# Patient Record
Sex: Male | Born: 1935 | Race: White | Marital: Married | Smoking: Never smoker
Health system: Southern US, Community
[De-identification: ages and names within clinical notes are randomized; demographics above are authoritative.]

## PROBLEM LIST (undated history)

## (undated) DIAGNOSIS — M25552 Pain in left hip: Secondary | ICD-10-CM

## (undated) DIAGNOSIS — K5790 Diverticulosis of intestine, part unspecified, without perforation or abscess without bleeding: Secondary | ICD-10-CM

## (undated) DIAGNOSIS — J45909 Unspecified asthma, uncomplicated: Secondary | ICD-10-CM

## (undated) DIAGNOSIS — G8929 Other chronic pain: Secondary | ICD-10-CM

## (undated) DIAGNOSIS — Z889 Allergy status to unspecified drugs, medicaments and biological substances status: Secondary | ICD-10-CM

## (undated) DIAGNOSIS — I82409 Acute embolism and thrombosis of unspecified deep veins of unspecified lower extremity: Secondary | ICD-10-CM

## (undated) DIAGNOSIS — K219 Gastro-esophageal reflux disease without esophagitis: Secondary | ICD-10-CM

## (undated) DIAGNOSIS — I10 Essential (primary) hypertension: Secondary | ICD-10-CM

## (undated) DIAGNOSIS — M810 Age-related osteoporosis without current pathological fracture: Secondary | ICD-10-CM

## (undated) DIAGNOSIS — M549 Dorsalgia, unspecified: Secondary | ICD-10-CM

## (undated) HISTORY — DX: Gastro-esophageal reflux disease without esophagitis: K21.9

## (undated) HISTORY — PX: MENISCUS REPAIR: SHX5179

## (undated) HISTORY — DX: Dorsalgia, unspecified: M54.9

## (undated) HISTORY — DX: Pain in left hip: M25.552

## (undated) HISTORY — DX: Other chronic pain: G89.29

## (undated) HISTORY — PX: CATARACT EXTRACTION: SUR2

## (undated) HISTORY — DX: Unspecified asthma, uncomplicated: J45.909

## (undated) HISTORY — DX: Allergy status to unspecified drugs, medicaments and biological substances: Z88.9

## (undated) HISTORY — DX: Essential (primary) hypertension: I10

## (undated) HISTORY — DX: Diverticulosis of intestine, part unspecified, without perforation or abscess without bleeding: K57.90

## (undated) HISTORY — DX: Age-related osteoporosis without current pathological fracture: M81.0

## (undated) HISTORY — DX: Acute embolism and thrombosis of unspecified deep veins of unspecified lower extremity: I82.409

---

## 2013-09-09 ENCOUNTER — Encounter: Payer: Self-pay | Admitting: Interventional Cardiology

## 2013-09-12 ENCOUNTER — Other Ambulatory Visit: Payer: Self-pay | Admitting: Interventional Cardiology

## 2013-09-13 ENCOUNTER — Encounter: Payer: Self-pay | Admitting: Interventional Cardiology

## 2013-09-13 ENCOUNTER — Ambulatory Visit (INDEPENDENT_AMBULATORY_CARE_PROVIDER_SITE_OTHER): Payer: Medicare Other | Admitting: Interventional Cardiology

## 2013-09-13 ENCOUNTER — Ambulatory Visit (INDEPENDENT_AMBULATORY_CARE_PROVIDER_SITE_OTHER): Payer: Medicare Other | Admitting: Pharmacist

## 2013-09-13 VITALS — BP 135/75 | HR 89 | Ht 69.0 in | Wt 195.0 lb

## 2013-09-13 DIAGNOSIS — R609 Edema, unspecified: Secondary | ICD-10-CM

## 2013-09-13 DIAGNOSIS — I359 Nonrheumatic aortic valve disorder, unspecified: Secondary | ICD-10-CM | POA: Insufficient documentation

## 2013-09-13 DIAGNOSIS — I824Y9 Acute embolism and thrombosis of unspecified deep veins of unspecified proximal lower extremity: Secondary | ICD-10-CM | POA: Insufficient documentation

## 2013-09-13 DIAGNOSIS — I8010 Phlebitis and thrombophlebitis of unspecified femoral vein: Secondary | ICD-10-CM

## 2013-09-13 DIAGNOSIS — Z79899 Other long term (current) drug therapy: Secondary | ICD-10-CM

## 2013-09-13 DIAGNOSIS — I10 Essential (primary) hypertension: Secondary | ICD-10-CM

## 2013-09-13 LAB — POCT INR: INR: 2.2

## 2013-09-13 MED ORDER — FUROSEMIDE 40 MG PO TABS: ORAL_TABLET | ORAL | Status: DC

## 2013-09-13 NOTE — Progress Notes (Signed)
Patient ID: Juan Tran, male   DOB: 11/16/1925, 79 y.o.   MRN: [id number]    [address] [place], [place]  [address] Phone: [phone] Fax:  [phone]  Date:  09/13/2013   ID:  Juan Tran, DOB 07/13/1925, MRN [id number]  PCP:  No primary provider on file.      History of Present Illness: Juan Tran is a 79 y.o. male with a history of DVT. He also has severe aortic stenosis. He denies any chest pain, dyspnea on exertion or syncope. His walking is most limited by left hip pain. Surgery has been suggested. He has been concerned about the rehab. He has chronic lower extremity edema which is worse. He takes Coumadin for DVT. He has no bleeding problems. He is on several blood pressure medications. His blood pressure has been controlled. He is off of Tekturna.  Leg swelling improves with elevation. he does have some breathing difficulty which he thinks is related to allergies and bending down.  He is unsure about hip surgery.      Wt Readings from Last 3 Encounters:  09/13/13 195 lb (88.451 kg)     Past Medical History  Diagnosis Date  . Chronic back pain   . DVT (deep venous thrombosis)     on chronic coumandin  . GERD (gastroesophageal reflux disease)   . Asthma   . Osteoporosis     with compresssions fractures, formerly seeing pain managementfor epidurals,has had kyphoplasty  . Left hip pain   . H/O seasonal allergies   . Diverticulosis     barium enema 2005  . Hypertension     echo4/16/2013/ LV EF estimated 2D at 60-65 %    Current Outpatient Prescriptions  Medication Sig Dispense Refill  . albuterol (PROVENTIL HFA;VENTOLIN HFA) 108 (90 BASE) MCG/ACT inhaler Inhale 2 puffs into the lungs every 6 (six) hours as needed for wheezing.      . Azelastine-Fluticasone (DYMISTA NA) Place into the nose. 1 spray each nostril twice a day      . budesonide (PULMICORT) 180 MCG/ACT inhaler Inhale 1 puff into the lungs 2 (two) times daily.      . Calcium  Carbonate-Vitamin D (CALCIUM + D PO) Take by mouth. Take daily      . carvedilol (COREG) 6.25 MG tablet Take 6.25 mg by mouth 2 (two) times daily with a meal.      . dexlansoprazole (DEXILANT) 60 MG capsule Take 60 mg by mouth daily.      Marland Kitchen Fexofenadine HCl (MUCINEX ALLERGY PO) Take by mouth.      . fluticasone (FLONASE) 50 MCG/ACT nasal spray Place 2 sprays into the nose daily.      . furosemide (LASIX) 20 MG tablet Take 2 tabs daily      . NIFEdipine (PROCARDIA-XL/ADALAT CC) 60 MG 24 hr tablet Take 60 mg by mouth daily.      . tamsulosin (FLOMAX) 0.4 MG CAPS capsule Take by mouth. Take 1 tab daily      . traMADol (ULTRAM) 50 MG tablet Take 50 mg by mouth every 6 (six) hours as needed for pain.      Marland Kitchen warfarin (COUMADIN) 5 MG tablet TAKE 1/2 TABLET BY MOUTH EVERY DAY, EXCEPT 1 TABLET BY MOUTH ON SATURDAYS  30 tablet  0   No current facility-administered medications for this visit.    Allergies:    Allergies  Allergen Reactions  . Asa [Aspirin]   . Cat Hair Extract  Social History:  The patient  reports that he has never smoked. He does not have any smokeless tobacco history on file. He reports that he drinks alcohol.   Family History:  The patient's family history is not on file.   ROS:  Please see the history of present illness.  No nausea, vomiting.  No fevers, chills.  No focal weakness.  No dysuria. Joint pains,  All other systems reviewed and negative.   PHYSICAL EXAM: VS:  BP 135/75  Pulse 89  Ht 5\' 9"  (1.753 m)  Wt 195 lb (88.451 kg)  BMI 28.78 kg/m2 Well nourished, well developed, in no acute distress HEENT: normal Neck: no JVD, no carotid bruits Cardiac:  normal S1, S2; irregularly irregularity 3/6 systolic murmur Lungs:  clear to auscultation bilaterally, no wheezing, rhonchi or rales Abd: soft, nontender, no hepatomegaly Ext: no edema Skin: warm and dry Neuro:   no focal abnormalities noted       ASSESSMENT AND PLAN:  Aortic valve disorders  Notes: We  discussed his severe aortic stenosis. HE may be a candidate for TAVR. Mean gradient across aortic valve is greater than 40 mm Hg. His mobility is significantly limited. I think rehabilitation from open valve replacement may be difficult. He is not having much in the way of cardiac symptoms. General anesthesia may be difficult for him to tolerate if he needs a hip replacement. He will let us know if he develops CP, SHOB, or lightheadedness/passing out.  he is not very interested in further workup for TAVR.  He is willing to repeat an echocardiogram.  2. Benign hypertension /leg swelling Notes: Controlled. Avoid hypotension given aortic stenosis.  will increase Lasix to 40 mg twice a day to help with leg swelling. Elevate legs as well.  3. Anticoagulant long-term use  Notes: INR follwed. COumadin for DVT.  4. Shortness of breath   Notes: likely multifactorial. He thinks he is having problems with allergies. Given his severe aortic stenosis, we'll  recheck LV function.    Signed, Fredric Mare, MD, [facility] 09/13/2013 2:52 PM

## 2013-09-13 NOTE — Patient Instructions (Addendum)
Your physician wants you to follow-up in: 6 months with Dr. Eldridge Dace. You will receive a reminder letter in the mail two months in advance. If you don't receive a letter, please call our office to schedule the follow-up appointment.  Your physician recommends that you return for lab work in: 1 week on October 15th.  Increase Lasix to 40mg  1 tablet twice a day.  Your physician has requested that you have an echocardiogram. Echocardiography is a painless test that uses sound waves to create images of your heart. It provides your doctor with information about the size and shape of your heart and how well your heart's chambers and valves are working. This procedure takes approximately one hour. There are no restrictions for this procedure.

## 2013-09-14 ENCOUNTER — Ambulatory Visit: Payer: Self-pay | Admitting: Interventional Cardiology

## 2013-09-22 ENCOUNTER — Other Ambulatory Visit (INDEPENDENT_AMBULATORY_CARE_PROVIDER_SITE_OTHER): Payer: Medicare Other

## 2013-09-22 ENCOUNTER — Ambulatory Visit (HOSPITAL_COMMUNITY): Payer: Medicare Other | Attending: Internal Medicine

## 2013-09-22 ENCOUNTER — Other Ambulatory Visit (HOSPITAL_COMMUNITY): Payer: Self-pay | Admitting: Interventional Cardiology

## 2013-09-22 DIAGNOSIS — R609 Edema, unspecified: Secondary | ICD-10-CM | POA: Insufficient documentation

## 2013-09-22 DIAGNOSIS — I359 Nonrheumatic aortic valve disorder, unspecified: Secondary | ICD-10-CM

## 2013-09-22 DIAGNOSIS — I10 Essential (primary) hypertension: Secondary | ICD-10-CM

## 2013-09-22 DIAGNOSIS — Z79899 Other long term (current) drug therapy: Secondary | ICD-10-CM

## 2013-09-22 DIAGNOSIS — Z86718 Personal history of other venous thrombosis and embolism: Secondary | ICD-10-CM | POA: Insufficient documentation

## 2013-09-22 LAB — BASIC METABOLIC PANEL
CO2: 27 mEq/L (ref 19–32)
Chloride: 102 mEq/L (ref 96–112)
Creatinine, Ser: 1.4 mg/dL (ref 0.4–1.5)
Glucose, Bld: 96 mg/dL (ref 70–99)
Potassium: 4.3 mEq/L (ref 3.5–5.1)
Sodium: 139 mEq/L (ref 135–145)

## 2013-09-22 NOTE — Progress Notes (Signed)
Echocardiogram performed.  

## 2013-09-28 ENCOUNTER — Telehealth: Payer: Self-pay | Admitting: Cardiology

## 2013-09-28 DIAGNOSIS — Z79899 Other long term (current) drug therapy: Secondary | ICD-10-CM

## 2013-09-28 MED ORDER — FUROSEMIDE 40 MG PO TABS: ORAL_TABLET | ORAL | Status: DC

## 2013-09-28 NOTE — Telephone Encounter (Signed)
Message copied by Theda Sers on Thu Sep 28, 2013  8:32 AM ------      Message from: Corky Crafts      Created: Tue Sep 26, 2013  6:04 PM       OK to increase to 80 mg in AM and 40 mg in afternoon.  BMet in one week. ------

## 2013-09-28 NOTE — Telephone Encounter (Signed)
Pt notified to increase lasix. Meds updated and Bmet ordered.

## 2013-09-28 NOTE — Progress Notes (Signed)
Pt notified. Meds updated and labs ordered for 10/05/13.

## 2013-10-05 ENCOUNTER — Other Ambulatory Visit (INDEPENDENT_AMBULATORY_CARE_PROVIDER_SITE_OTHER): Payer: Medicare Other

## 2013-10-05 DIAGNOSIS — R609 Edema, unspecified: Secondary | ICD-10-CM

## 2013-10-05 DIAGNOSIS — I10 Essential (primary) hypertension: Secondary | ICD-10-CM

## 2013-10-05 DIAGNOSIS — Z79899 Other long term (current) drug therapy: Secondary | ICD-10-CM

## 2013-10-05 DIAGNOSIS — I824Y9 Acute embolism and thrombosis of unspecified deep veins of unspecified proximal lower extremity: Secondary | ICD-10-CM

## 2013-10-05 DIAGNOSIS — I359 Nonrheumatic aortic valve disorder, unspecified: Secondary | ICD-10-CM

## 2013-10-05 LAB — BASIC METABOLIC PANEL
BUN: 21 mg/dL (ref 6–23)
CO2: 26 mEq/L (ref 19–32)
Chloride: 104 mEq/L (ref 96–112)
Creatinine, Ser: 1.3 mg/dL (ref 0.4–1.5)
GFR: 55.09 mL/min — ABNORMAL LOW (ref >60.00)
Sodium: 139 mEq/L (ref 135–145)

## 2013-10-15 ENCOUNTER — Other Ambulatory Visit (HOSPITAL_COMMUNITY): Payer: Self-pay | Admitting: Interventional Cardiology

## 2013-10-25 ENCOUNTER — Ambulatory Visit (INDEPENDENT_AMBULATORY_CARE_PROVIDER_SITE_OTHER): Payer: Medicare Other | Admitting: Pharmacist

## 2013-10-25 ENCOUNTER — Encounter (INDEPENDENT_AMBULATORY_CARE_PROVIDER_SITE_OTHER): Payer: Self-pay

## 2013-10-25 DIAGNOSIS — I8010 Phlebitis and thrombophlebitis of unspecified femoral vein: Secondary | ICD-10-CM

## 2013-10-25 LAB — POCT INR: INR: 2.3

## 2013-10-25 MED ORDER — FUROSEMIDE 40 MG PO TABS: ORAL_TABLET | ORAL | Status: AC

## 2013-11-20 ENCOUNTER — Other Ambulatory Visit: Payer: Self-pay | Admitting: Interventional Cardiology

## 2013-12-05 ENCOUNTER — Ambulatory Visit (INDEPENDENT_AMBULATORY_CARE_PROVIDER_SITE_OTHER): Payer: Medicare Other | Admitting: Pharmacist

## 2013-12-05 DIAGNOSIS — I8010 Phlebitis and thrombophlebitis of unspecified femoral vein: Secondary | ICD-10-CM

## 2013-12-05 LAB — POCT INR: INR: 3.2

## 2013-12-28 ENCOUNTER — Ambulatory Visit
Admission: RE | Admit: 2013-12-28 | Discharge: 2013-12-28 | Disposition: A | Discharge status: Home / Self Care | Payer: Medicare Other | Source: Ambulatory Visit | Attending: Internal Medicine | Admitting: Internal Medicine

## 2013-12-28 ENCOUNTER — Other Ambulatory Visit: Payer: Self-pay | Admitting: Internal Medicine

## 2013-12-28 DIAGNOSIS — R52 Pain, unspecified: Secondary | ICD-10-CM

## 2013-12-28 DIAGNOSIS — R609 Edema, unspecified: Secondary | ICD-10-CM

## 2014-01-01 ENCOUNTER — Telehealth: Payer: Self-pay | Admitting: Interventional Cardiology

## 2014-01-01 NOTE — Telephone Encounter (Signed)
Juan Tran, can pt come off coumadin for a procedure? Dr. Eldridge DaceVaranasi will not be back in the office until next week.

## 2014-01-01 NOTE — Telephone Encounter (Signed)
Patient had a DVT in the 1990's and then it seems had another DVT occur after coming off coumadin, so was put on coumadin for lifelong therapy.  What sort of procedure does patient need to have?  I don't see anything in [facility] system.

## 2014-01-01 NOTE — Telephone Encounter (Signed)
Pt is scheduled to see you tomorrow and he will speak with you tomorrow about this. I told pt he is supposed to be coumadin lifelong and he is aware.

## 2014-01-01 NOTE — Telephone Encounter (Signed)
New Message  Dr. Gust BroomsJaralla called states the pt is on coumadin and request a call back to determine the possiblity of coming off of the coumadin for a procedure// Please call.

## 2014-01-01 NOTE — Telephone Encounter (Signed)
Spoke with pt and he went to [facility] for his annual check up and Dr. Gust BroomsJaralla suggested that pt have doppler to see if pt needed to continue the coumdain.

## 2014-01-02 ENCOUNTER — Ambulatory Visit (INDEPENDENT_AMBULATORY_CARE_PROVIDER_SITE_OTHER): Payer: Medicare Other | Admitting: Pharmacist

## 2014-01-02 DIAGNOSIS — Z5181 Encounter for therapeutic drug level monitoring: Secondary | ICD-10-CM

## 2014-01-02 DIAGNOSIS — I824Y9 Acute embolism and thrombosis of unspecified deep veins of unspecified proximal lower extremity: Secondary | ICD-10-CM

## 2014-01-02 DIAGNOSIS — I8010 Phlebitis and thrombophlebitis of unspecified femoral vein: Secondary | ICD-10-CM

## 2014-01-02 LAB — POCT INR: INR: 2.7

## 2014-01-02 NOTE — Telephone Encounter (Signed)
Discussed with patient in clinic.  INR was 2.7 today.  Patient a DVT in 1990s, started warfarin, then after stopping it developed a questionable DVT, and was told he would be lifelong coumadin patient.  A doppler was never done following this second possible DVT per patient.  Patient had a doppler done a few days ago 12/28/13 per Dr. Gust BroomsJaralla which was negative for DVT.  She will likely call you in a few days to discuss option of stopping vs continuing warfarin.   FYI.

## 2014-01-18 ENCOUNTER — Telehealth: Payer: Self-pay | Admitting: Interventional Cardiology

## 2014-01-19 ENCOUNTER — Telehealth: Payer: Self-pay | Admitting: Interventional Cardiology

## 2014-01-19 NOTE — Telephone Encounter (Signed)
[facility] Heather at [phone]

## 2014-01-24 ENCOUNTER — Telehealth: Payer: Self-pay | Admitting: Cardiovascular Disease

## 2014-01-24 NOTE — Telephone Encounter (Signed)
Original D/C received gave to Wyn[facility] Caneshristine For Dr.Nishan To Sign

## 2014-01-24 NOTE — Telephone Encounter (Signed)
D/C signed By Vilma Praderr.Nishan, Mike/[facility] aware ready For Pick Up (P) [phone]

## 2014-01-24 NOTE — Telephone Encounter (Signed)
D/C Picked Up  °

## 2014-02-04 NOTE — Telephone Encounter (Signed)
D/C signed By Dr.Nishan, [facility] to [facility] [phone]

## 2014-02-04 NOTE — Telephone Encounter (Signed)
Phil [facility]ateri [facility]uke [facility] Ne[facility]C Signed Today Dr.Varanasi is Out Of the Office Today Dr.Peter Eden Emmsishan has agreed to Sign Made Phil aware and [facility]  Phil Duke Contact Info: [phone]) [phone]

## 2014-02-04 NOTE — Telephone Encounter (Signed)
Faxed Copy Of [facility] gave to [employer] For [employer]an Signature

## 2014-02-04 DEATH — deceased

## 2015-05-28 IMAGING — US US EXTREM LOW VENOUS*R*
1 series · 14 of 24 positions shown · non-contrast
Comparison: None.

CLINICAL DATA: Right leg pain/swelling

EXAM:
Right LOWER EXTREMITY VENOUS DOPPLER ULTRASOUND
TECHNIQUE: Gray-scale sonography with graded compression, as well as color
Doppler and duplex ultrasound, were performed to evaluate the deep
venous system from the level of the common femoral vein through the
popliteal and proximal calf veins. Spectral Doppler was utilized to
evaluate flow at rest and with distal augmentation maneuvers.

[Series 1: us extrem low venous*right* · 14 of 41 slices shown]
[im 1/41]
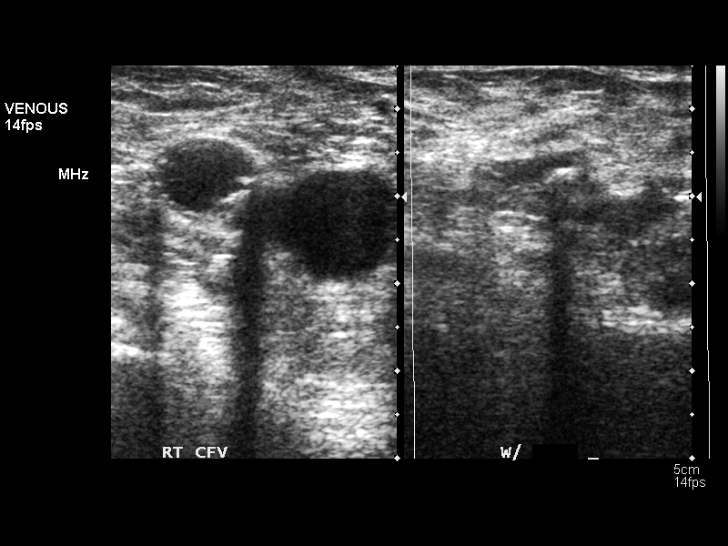
[im 4/41]
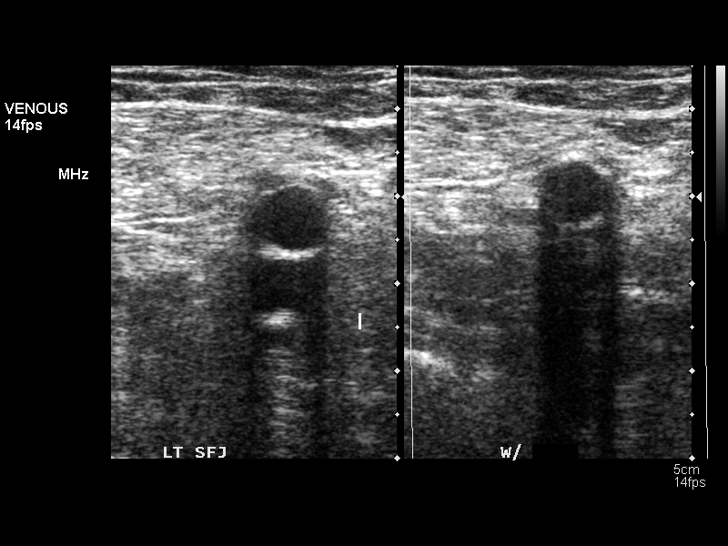
[im 7/41]
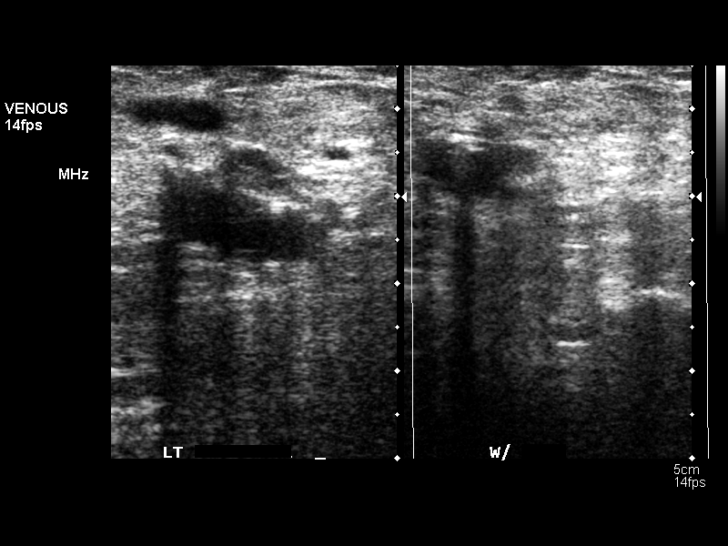
[im 11/41]
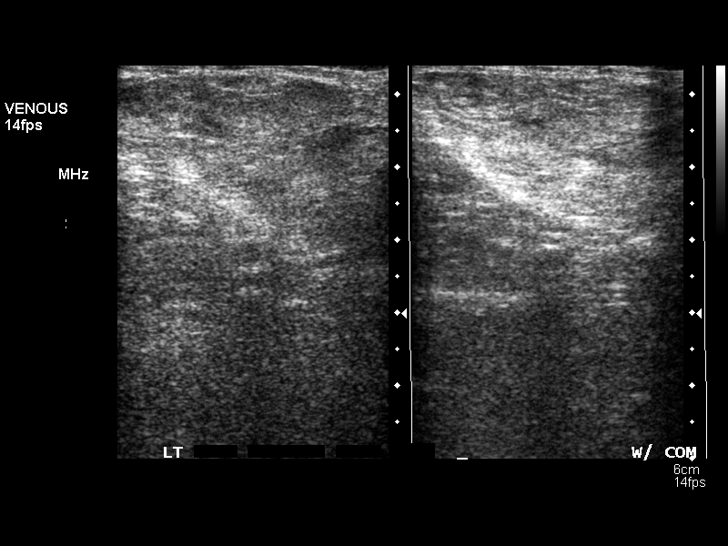
[im 13/41]
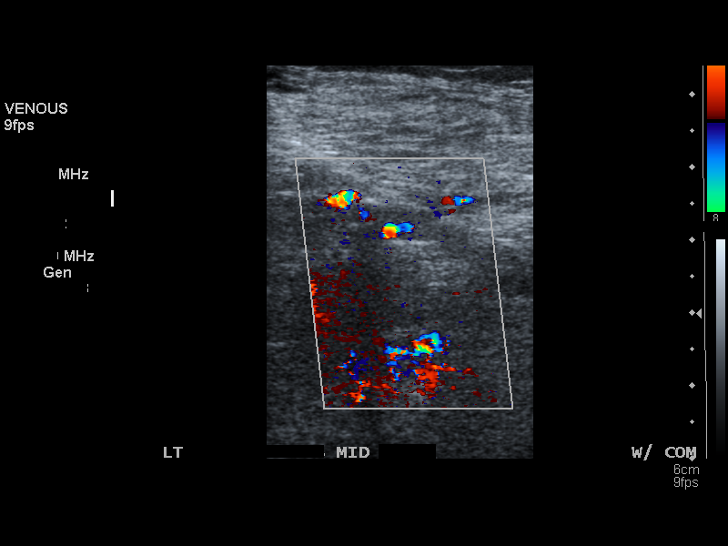
[im 16/41]
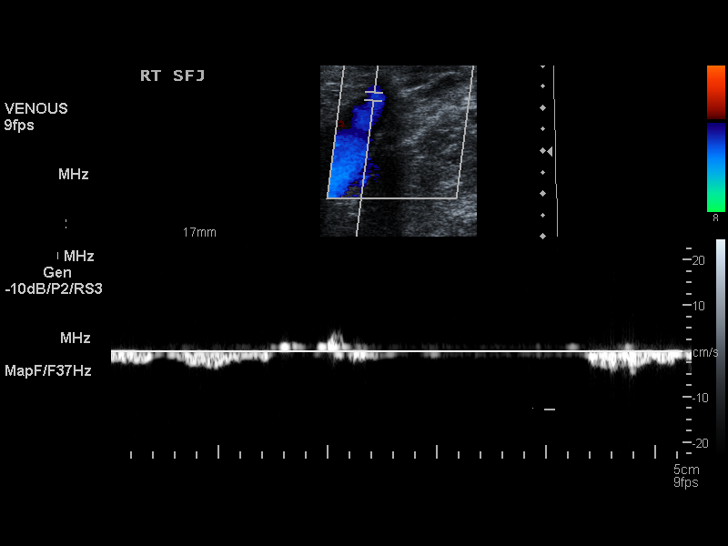
[im 20/41]
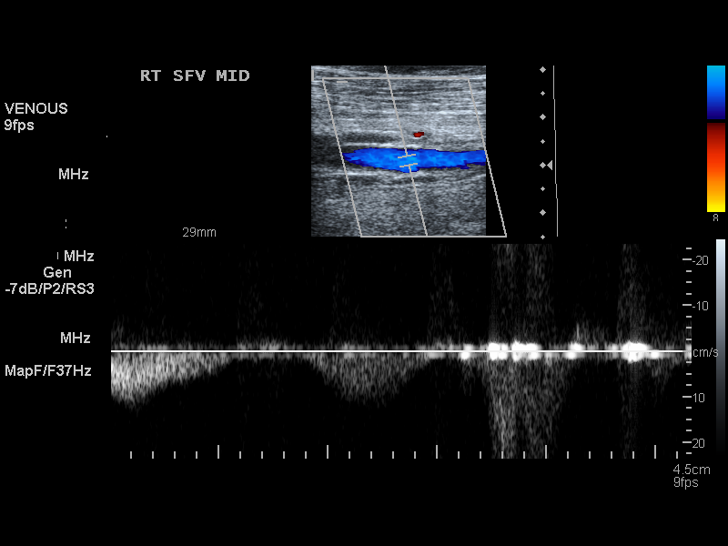
[im 21/41]
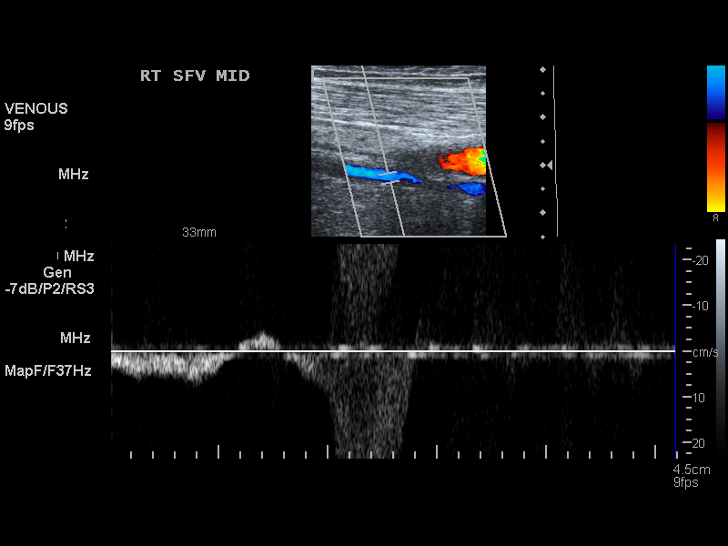
[im 25/41]
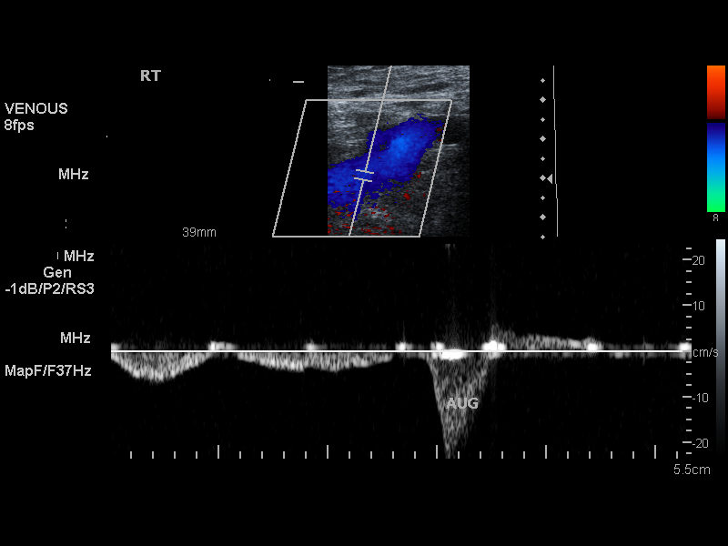
[im 28/41]
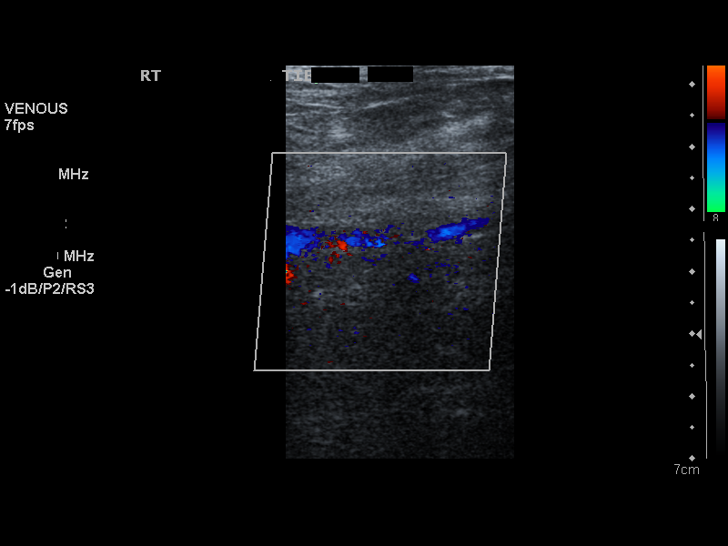
[im 32/41]
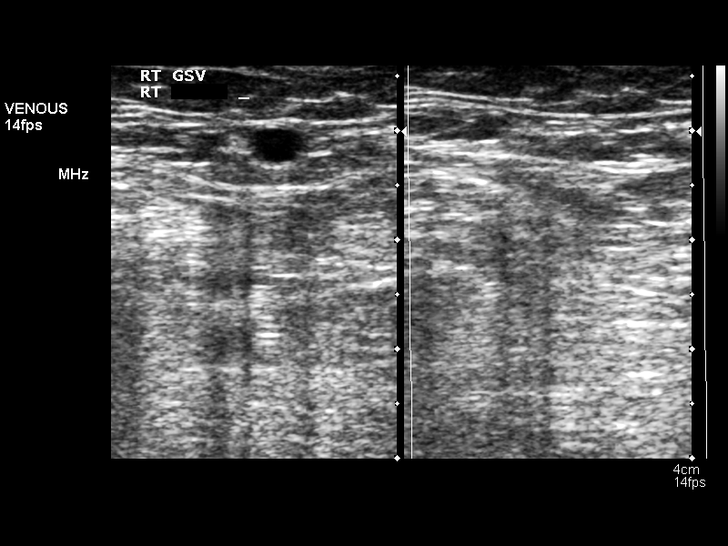
[im 34/41]
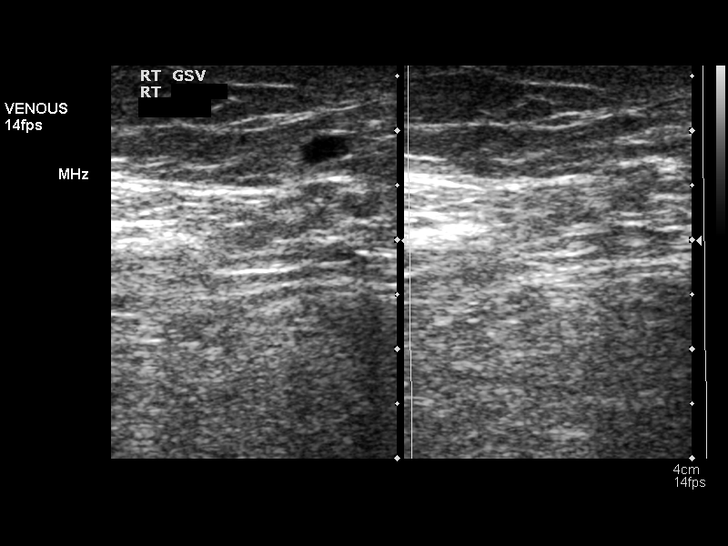
[im 37/41]
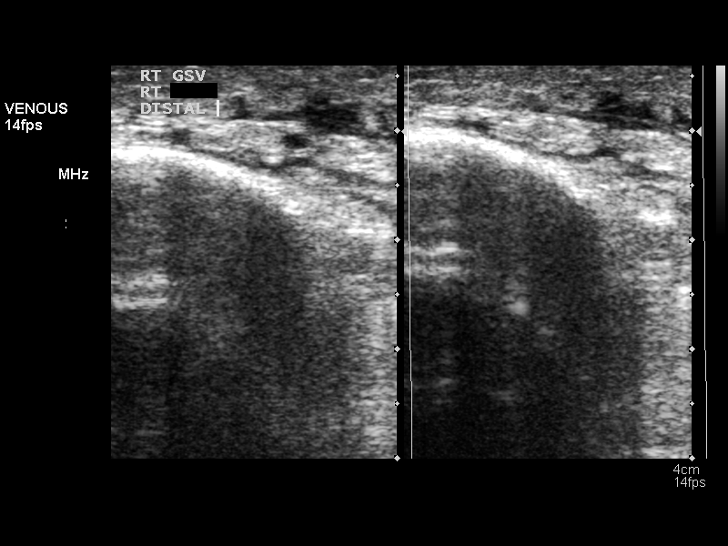
[im 41/41]
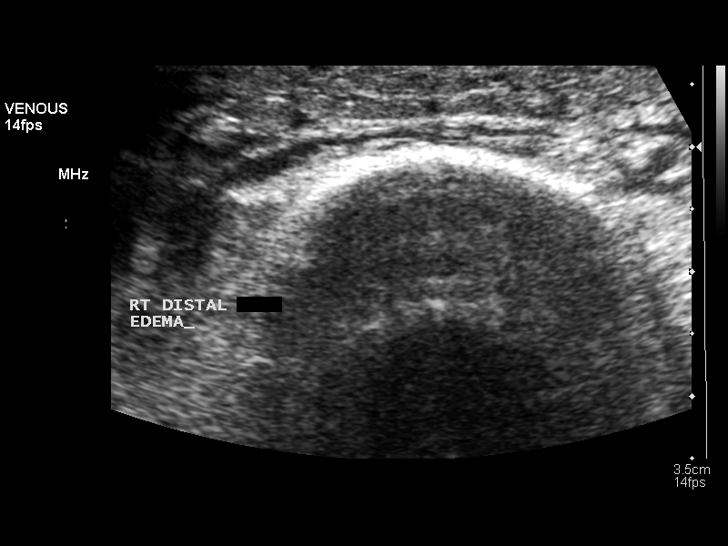

[14 of 24 positions shown; findings below may reference images not displayed]

FINDINGS: Please note that some of the ultrasound images are mistakenly
labeled left. However, all imaging is of the right lower extremity.

The visualized right lower extremity deep venous system appears
patent.

Normal compressibility. Patent color Doppler flow. Satisfactory
spectral Doppler with respiratory variation and response to
augmentation.

Greater saphenous vein, where visualized, is patent and
compressible.

Subcutaneous edema in the distal calf.
IMPRESSION: No deep venous thrombosis in the visualized right lower extremity.
